# Patient Record
Sex: Male | Born: 1980 | Race: White | Hispanic: No | Marital: Single
Health system: Southern US, Community
[De-identification: ages and names within clinical notes are randomized; demographics above are authoritative.]

---

## 2018-07-14 ENCOUNTER — Emergency Department (HOSPITAL_COMMUNITY): Payer: 59

## 2018-07-14 ENCOUNTER — Other Ambulatory Visit: Payer: Self-pay

## 2018-07-14 ENCOUNTER — Encounter (HOSPITAL_COMMUNITY): Payer: Self-pay | Admitting: Emergency Medicine

## 2018-07-14 ENCOUNTER — Emergency Department (HOSPITAL_COMMUNITY)
Admission: EM | Admit: 2018-07-14 | Discharge: 2018-07-14 | Disposition: A | Discharge status: Home / Self Care | Payer: 59 | Attending: Emergency Medicine | Admitting: Emergency Medicine

## 2018-07-14 DIAGNOSIS — Y998 Other external cause status: Secondary | ICD-10-CM | POA: Diagnosis not present

## 2018-07-14 DIAGNOSIS — F1410 Cocaine abuse, uncomplicated: Secondary | ICD-10-CM | POA: Diagnosis not present

## 2018-07-14 DIAGNOSIS — Y92410 Unspecified street and highway as the place of occurrence of the external cause: Secondary | ICD-10-CM | POA: Diagnosis not present

## 2018-07-14 DIAGNOSIS — R55 Syncope and collapse: Secondary | ICD-10-CM | POA: Diagnosis not present

## 2018-07-14 DIAGNOSIS — Y9389 Activity, other specified: Secondary | ICD-10-CM | POA: Insufficient documentation

## 2018-07-14 LAB — CBC WITH DIFFERENTIAL/PLATELET
Abs Immature Granulocytes: 0.03 10*3/uL (ref 0.00–0.07)
Basophils Absolute: 0.1 10*3/uL (ref 0.0–0.1)
Basophils Relative: 1 %
Eosinophils Absolute: 0.1 10*3/uL (ref 0.0–0.5)
Eosinophils Relative: 1 %
HEMATOCRIT: 49.9 % (ref 39.0–52.0)
Hemoglobin: 16.1 g/dL (ref 13.0–17.0)
IMMATURE GRANULOCYTES: 0 %
LYMPHS ABS: 1.1 10*3/uL (ref 0.7–4.0)
LYMPHS PCT: 14 %
MCH: 29.9 pg (ref 26.0–34.0)
MCHC: 32.3 g/dL (ref 30.0–36.0)
MCV: 92.6 fL (ref 80.0–100.0)
Monocytes Absolute: 1.4 10*3/uL — ABNORMAL HIGH (ref 0.1–1.0)
Monocytes Relative: 18 %
Neutro Abs: 5.4 10*3/uL (ref 1.7–7.7)
Neutrophils Relative %: 66 %
Platelets: 244 10*3/uL (ref 150–400)
RBC: 5.39 MIL/uL (ref 4.22–5.81)
RDW: 12.9 % (ref 11.5–15.5)
WBC: 8.1 10*3/uL (ref 4.0–10.5)
nRBC: 0 % (ref 0.0–0.2)

## 2018-07-14 LAB — COMPREHENSIVE METABOLIC PANEL
ALBUMIN: 4.1 g/dL (ref 3.5–5.0)
ALT: 26 U/L (ref 0–44)
AST: 43 U/L — AB (ref 15–41)
Alkaline Phosphatase: 40 U/L (ref 38–126)
Anion gap: 12 (ref 5–15)
BUN: 14 mg/dL (ref 6–20)
CHLORIDE: 101 mmol/L (ref 98–111)
CO2: 22 mmol/L (ref 22–32)
Calcium: 9 mg/dL (ref 8.9–10.3)
Creatinine, Ser: 1.39 mg/dL — ABNORMAL HIGH (ref 0.61–1.24)
GFR calc Af Amer: >60 mL/min (ref >60)
GFR calc non Af Amer: >60 mL/min (ref >60)
GLUCOSE: 101 mg/dL — AB (ref 70–99)
Potassium: 4.6 mmol/L (ref 3.5–5.1)
Sodium: 135 mmol/L (ref 135–145)
Total Bilirubin: 0.9 mg/dL (ref 0.3–1.2)
Total Protein: 7.2 g/dL (ref 6.5–8.1)

## 2018-07-14 LAB — TROPONIN I
Troponin I: <0.03 ng/mL (ref <0.03)
Troponin I: <0.03 ng/mL (ref <0.03)

## 2018-07-14 LAB — RAPID URINE DRUG SCREEN, HOSP PERFORMED
Amphetamines: NOT DETECTED
Barbiturates: NOT DETECTED
Benzodiazepines: NOT DETECTED
Cocaine: POSITIVE — AB
Opiates: NOT DETECTED
Tetrahydrocannabinol: NOT DETECTED

## 2018-07-14 LAB — I-STAT TROPONIN, ED: Troponin i, poc: 0.09 ng/mL (ref 0.00–0.08)

## 2018-07-14 MED ORDER — SODIUM CHLORIDE 0.9 % IV BOLUS
1000.0000 mL | Freq: Once | INTRAVENOUS | Status: AC
  Administered 2018-07-14: 1000 mL via INTRAVENOUS
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
## 2018-07-14 NOTE — ED Notes (Signed)
Patient denies pain and is resting comfortably.  
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
## 2018-07-14 NOTE — ED Notes (Signed)
Patient verbalizes understanding of discharge instructions. Opportunity for questioning and answers were provided. Armband removed by staff, pt discharged from ED.  

## 2018-07-14 NOTE — ED Notes (Signed)
Patient transported to imaging.
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
